# Patient Record
Sex: Male | Born: 1995 | Race: White | Hispanic: No | Marital: Single | Smoking: Current every day smoker
Health system: Southern US, Community
[De-identification: ages and names within clinical notes are randomized; demographics above are authoritative.]

---
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
## 2019-06-22 ENCOUNTER — Other Ambulatory Visit: Payer: Self-pay

## 2019-06-22 ENCOUNTER — Emergency Department (HOSPITAL_COMMUNITY)
Admission: EM | Admit: 2019-06-22 | Discharge: 2019-06-22 | Disposition: A | Discharge status: Home / Self Care | Payer: Self-pay | Attending: Emergency Medicine | Admitting: Emergency Medicine

## 2019-06-22 ENCOUNTER — Encounter (HOSPITAL_COMMUNITY): Payer: Self-pay | Admitting: Emergency Medicine

## 2019-06-22 DIAGNOSIS — F17210 Nicotine dependence, cigarettes, uncomplicated: Secondary | ICD-10-CM | POA: Insufficient documentation

## 2019-06-22 DIAGNOSIS — R590 Localized enlarged lymph nodes: Secondary | ICD-10-CM | POA: Insufficient documentation

## 2019-06-22 MED ORDER — KETOROLAC TROMETHAMINE 60 MG/2ML IM SOLN
60.0000 mg | Freq: Once | INTRAMUSCULAR | Status: AC
  Administered 2019-06-22: 03:00:00 60 mg via INTRAMUSCULAR
  Filled 2019-06-22: qty 2

## 2019-06-22 MED ORDER — MELOXICAM 7.5 MG PO TABS: 7.5000 mg | ORAL_TABLET | Freq: Every day | ORAL | 0 refills | Status: AC

## 2019-06-22 NOTE — ED Triage Notes (Signed)
Patient is complaining of right neck pain that started about 7 months ago. Patient states he comes in tonight because the pain usually go away but has not tonight.

## 2019-06-22 NOTE — ED Provider Notes (Signed)
Emergency Department Provider Note   I have reviewed the triage vital signs and the nursing notes.   HISTORY  Chief Complaint Neck Pain   HPI Jeffrey Meyer is a 23 y.o. male who presents the emergency department today with any acute exacerbation of his chronic neck pain.  Patient states that multiple times over the last 7 months he has had neck pain.  Usually last a couple minutes and goes away.  He states today is lasted longer.  There is no associated trauma or strain to his neck but he has pain with movement of his neck which seems to be worse at the junction of his SCM with the sternum.  He also has some anterior right-sided neck pain and swelling.  Hurts to swallow sometimes.  No difficulty breathing.  No fevers.  No cough.  No other associated symptoms.   No other associated or modifying symptoms.    History reviewed. No pertinent past medical history.  There are no active problems to display for this patient.   History reviewed. No pertinent surgical history.  Current Outpatient Rx  . Order #: [id number] Class: Print    Allergies Patient has no known allergies.  History reviewed. No pertinent family history.  Social History Social History   Tobacco Use  . Smoking status: Current Every Day Smoker  . Smokeless tobacco: Never Used  Substance Use Topics  . Alcohol use: Not on file  . Drug use: Not on file    Review of Systems  All other systems negative except as documented in the HPI. All pertinent positives and negatives as reviewed in the HPI. ____________________________________________   PHYSICAL EXAM:  VITAL SIGNS: ED Triage Vitals  Enc Vitals Group     BP 06/22/19 0228 135/72     Pulse Rate 06/22/19 0228 92     Resp 06/22/19 0228 16     Temp 06/22/19 0228 97.7 F (36.5 C)     Temp Source 06/22/19 0228 Oral     SpO2 06/22/19 0228 99 %     Weight 06/22/19 0228 135 lb (61.2 kg)     Height 06/22/19 0228 5\' 11"  (1.803 m)     Head  Circumference --      Peak Flow --      Pain Score 06/22/19 0233 8     Pain Loc --      Pain Edu? --      Excl. in [place]? --     Constitutional: Alert and oriented. Well appearing and in no acute distress. Eyes: Conjunctivae are normal. PERRL. EOMI. Head: Atraumatic. Nose: No congestion/rhinnorhea. Mouth/Throat: Mucous membranes are moist.  Oropharynx non-erythematous. Neck: No stridor.  No meningeal signs.  Shoddy lymphadenopathy right anterior cervical chain. Cardiovascular: Normal rate, regular rhythm. Good peripheral circulation. Grossly normal heart sounds.   Respiratory: Normal respiratory effort.  No retractions. Lungs CTAB. Gastrointestinal: Soft and nontender. No distention.  Musculoskeletal: pain at insertion of SCM at right side of sternum Neurologic:  Normal speech and language. No gross focal neurologic deficits are appreciated.  Skin:  Skin is warm, dry and intact. No rash noted.  ____________________________________________   INITIAL IMPRESSION / ASSESSMENT AND PLAN / ED COURSE  Suspect patient has lymphadenopathy is probably was been caused him symptoms for the last couple months.  Suggested follow-up with PCP if not improving in a few weeks or if it progressively worsens.  He also has what seems to be some type of inflammation at the insertion of the sternocleidomastoid with  his right sternal area.  Will give anti-inflammatories for that.  No indication for imaging.  No evidence of infection.     Pertinent labs & imaging results that were available during my care of the patient were reviewed by me and considered in my medical decision making (see chart for details).  A medical screening exam was performed and I feel the patient has had an appropriate workup for their chief complaint at this time and likelihood of emergent condition existing is low. They have been counseled on decision, discharge, follow up and which symptoms necessitate immediate return to the emergency  department. They or their family verbally stated understanding and agreement with plan and discharged in stable condition.   ____________________________________________  FINAL CLINICAL IMPRESSION(S) / ED DIAGNOSES  Final diagnoses:  Lymphadenopathy, cervical     MEDICATIONS GIVEN DURING THIS VISIT:  Medications  ketorolac (TORADOL) injection 60 mg (60 mg Intramuscular Given 06/22/19 0310)     NEW OUTPATIENT MEDICATIONS STARTED DURING THIS VISIT:  New Prescriptions   MELOXICAM (MOBIC) 7.5 MG TABLET    Take 1 tablet (7.5 mg total) by mouth daily.    Note:  This note was prepared with assistance of Dragon voice recognition software. Occasional wrong-word or sound-a-like substitutions may have occurred due to the inherent limitations of voice recognition software.   Edy Belt, Barbara Cower, MD 06/22/19 [phone]

## 2019-08-05 ENCOUNTER — Ambulatory Visit (HOSPITAL_COMMUNITY)
Admission: EM | Admit: 2019-08-05 | Discharge: 2019-08-05 | Disposition: A | Discharge status: Home / Self Care | Payer: Self-pay | Attending: Family Medicine | Admitting: Family Medicine

## 2019-08-05 ENCOUNTER — Ambulatory Visit (INDEPENDENT_AMBULATORY_CARE_PROVIDER_SITE_OTHER): Payer: Self-pay

## 2019-08-05 ENCOUNTER — Encounter (HOSPITAL_COMMUNITY): Payer: Self-pay

## 2019-08-05 ENCOUNTER — Other Ambulatory Visit: Payer: Self-pay

## 2019-08-05 DIAGNOSIS — S99922A Unspecified injury of left foot, initial encounter: Secondary | ICD-10-CM

## 2019-08-05 MED ORDER — IBUPROFEN 800 MG PO TABS: 800.0000 mg | ORAL_TABLET | Freq: Three times a day (TID) | ORAL | 0 refills | Status: AC

## 2019-08-05 NOTE — ED Provider Notes (Signed)
[facility]    CSN: [id number] Arrival date & time: 08/05/19  1511      History   Chief Complaint Chief Complaint  Patient presents with  . Toe Injury    HPI Jeffrey Meyer is a 23 y.o. male.   51 y old presented to the urgent care with a complaint of left big toe pain X 2 days after dropping a wooden pallet on it.  Patient has been using ice and kept her leg elevated. The pain is rated at 8 on scale of 1-10 and it is a throbbing pain. Activity make  worse and using ice relief the symptom a bit.  The history is provided by the patient. No language interpreter was used.    History reviewed. No pertinent past medical history.  There are no active problems to display for this patient.   History reviewed. No pertinent surgical history.     Home Medications    Prior to Admission medications   Medication Sig Start Date End Date Taking? Authorizing Provider  meloxicam (MOBIC) 7.5 MG tablet Take 1 tablet (7.5 mg total) by mouth daily. 06/22/19   Mesner, Corene Cornea, MD    Family History Family History  Family history unknown: Yes    Social History Social History   Tobacco Use  . Smoking status: Current Every Day Smoker  . Smokeless tobacco: Never Used  Substance Use Topics  . Alcohol use: Not on file  . Drug use: Not on file     Allergies   Patient has no known allergies.   Review of Systems Review of Systems  Constitutional: Negative for activity change, appetite change, chills, fatigue and fever.  Respiratory: Negative.   Cardiovascular: Negative.   Musculoskeletal: Positive for joint swelling.  ROS: All other are negatives  Physical Exam Triage Vital Signs ED Triage Vitals [08/05/19 1602]  Enc Vitals Group     BP 116/67     Pulse Rate 100     Resp 18     Temp 98.1 F (36.7 C)     Temp Source Oral     SpO2 96 %     Weight      Height      Head Circumference      Peak Flow      Pain Score      Pain Loc      Pain Edu?    Excl. in [place]?    No data found.  Updated Vital Signs BP 116/67 (BP Location: Right Arm)   Pulse 100   Temp 98.1 F (36.7 C) (Oral)   Resp 18   SpO2 96%   Visual Acuity Right Eye Distance:   Left Eye Distance:   Bilateral Distance:    Right Eye Near:   Left Eye Near:    Bilateral Near:     Physical Exam Vitals signs and nursing note reviewed.  Constitutional:      General: He is not in acute distress.    Appearance: Normal appearance. He is normal weight. He is not ill-appearing or toxic-appearing.  Cardiovascular:     Rate and Rhythm: Normal rate and regular rhythm.     Pulses: Normal pulses.     Heart sounds: Normal heart sounds. No murmur.  Pulmonary:     Effort: Pulmonary effort is normal. No respiratory distress.     Breath sounds: Normal breath sounds.  Chest:     Chest wall: No tenderness.  Musculoskeletal: Normal range of motion.  Left ankle: Normal. He exhibits normal range of motion and no swelling.     Comments: Left great toe is tender with limited ROM  Neurological:     Mental Status: He is alert.   08/05/19     UC Treatments / Results  Labs (all labs ordered are listed, but only abnormal results are displayed) Labs Reviewed - No data to display  EKG   Radiology Dg Toe Great Left  Result Date: 08/05/2019 CLINICAL DATA:  Toe pain. EXAM: LEFT GREAT TOE COMPARISON:  None. FINDINGS: There is no evidence of fracture or dislocation. There is no evidence of arthropathy or other focal bone abnormality. Soft tissues are unremarkable. IMPRESSION: Negative. Electronically Signed   By: Katherine Mantle M.D.   On: 08/05/2019 16:19    Procedures Procedures (including critical care time)  Medications Ordered in UC Medications - No data to display  Initial Impression / Assessment and Plan / UC Course  I have reviewed the triage vital signs and the nursing notes.  Pertinent labs & imaging results that were available during my care of the patient  were reviewed by me and considered in my medical decision making (see chart for details).   Patient was discharge in stable condition and was advised to return to the urgent care if symptom get worse Final Clinical Impressions(s) / UC Diagnoses   Final diagnoses:  Toe injury, left, initial encounter     Discharge Instructions     Patient was advised to take Ibuprofen as needed for pain Follow RICE instruction     ED Prescriptions    None     PDMP not reviewed this encounter.   Durward Parcel, FNP 08/05/19 1645

## 2019-08-05 NOTE — Discharge Instructions (Addendum)
Patient was advised to take Ibuprofen as needed for pain Follow RICE instruction that is attached to the discharge instruction Return to the urgent care if symptom get worse

## 2019-08-05 NOTE — ED Triage Notes (Signed)
Pt presents with left big toe injury after dropping a wooden pallet on it Saturday night.

## 2020-07-26 IMAGING — DX DG TOE GREAT 2+V*L*
3 series · 3 of 3 positions shown · non-contrast
Comparison: None.

CLINICAL DATA: Toe pain.

EXAM:
LEFT GREAT TOE

[toe ap]
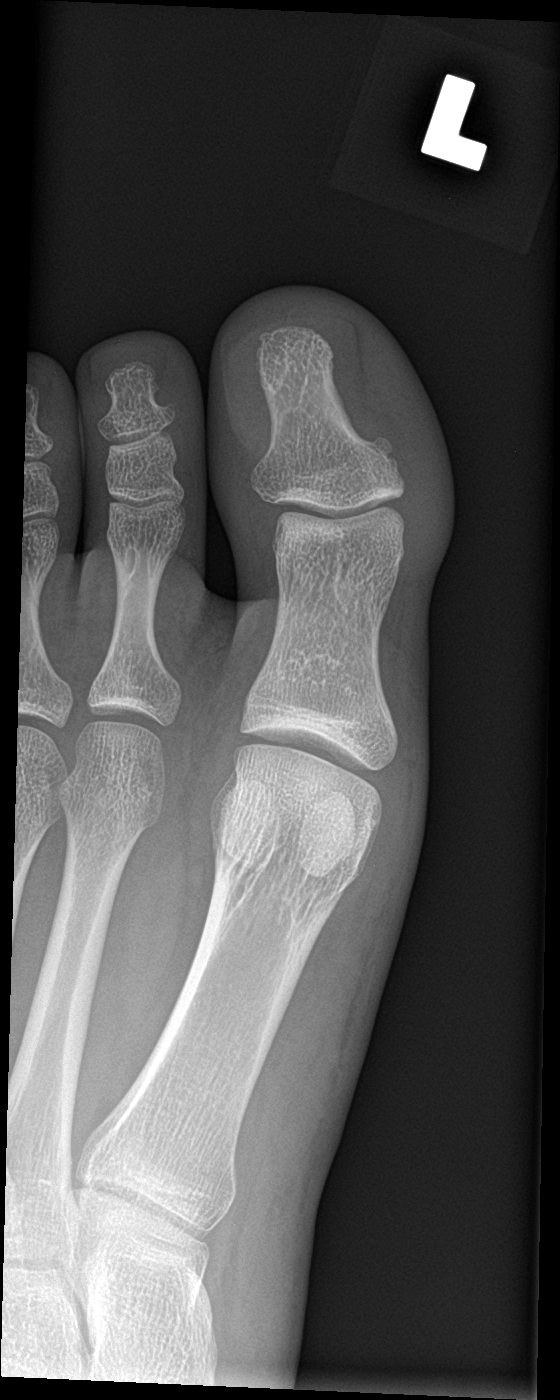

[toe obl]
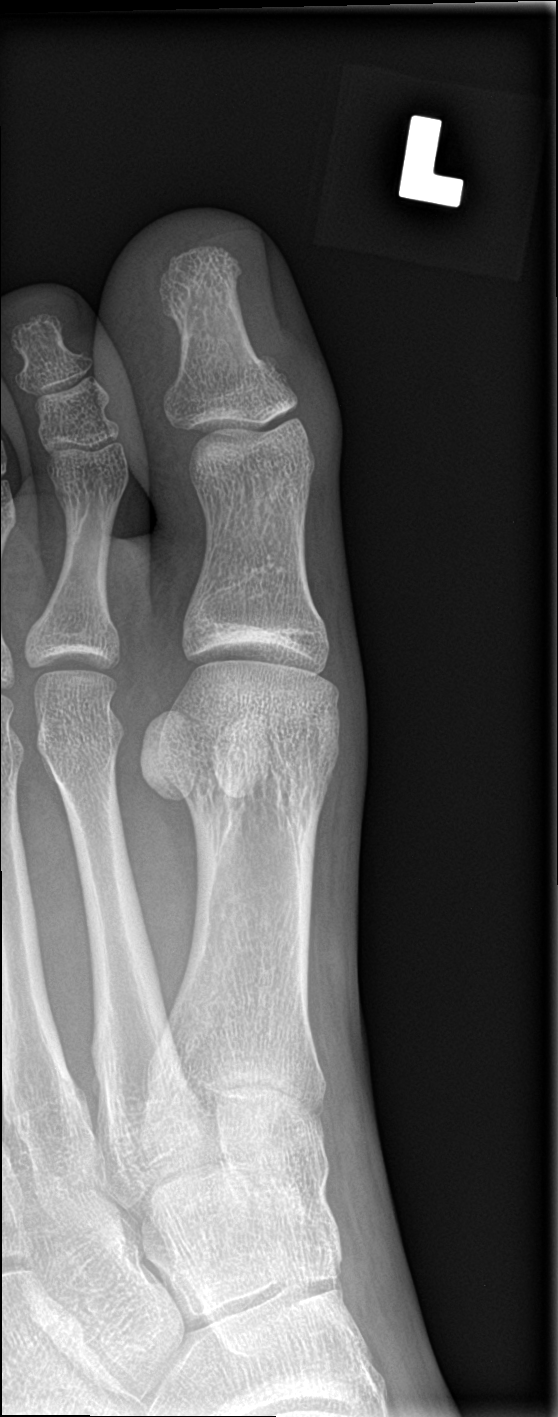

[toe lat]
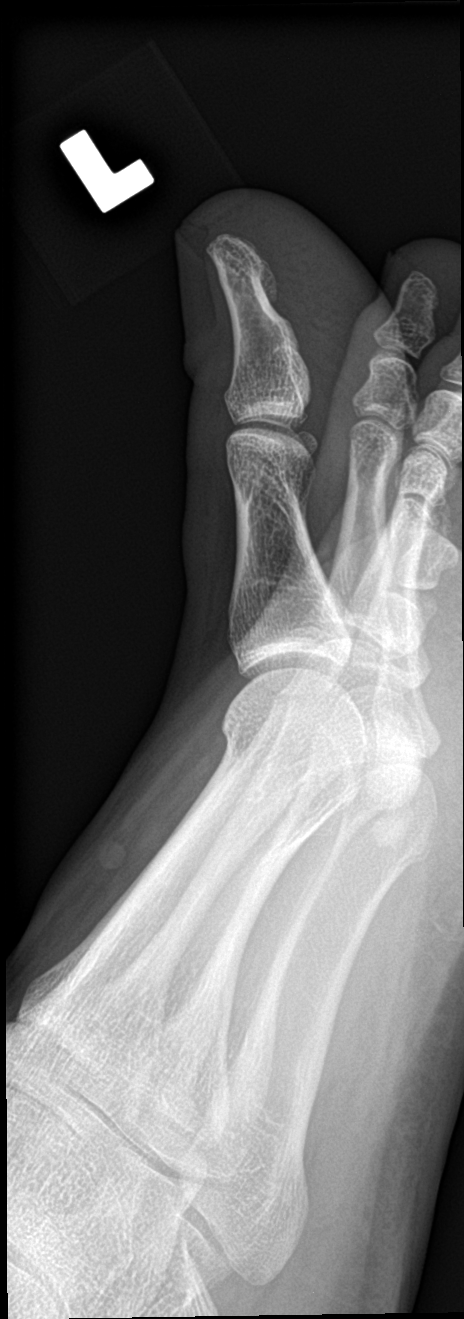

[3 of 3 positions shown; findings below may reference images not displayed]

FINDINGS: There is no evidence of fracture or dislocation. There is no
evidence of arthropathy or other focal bone abnormality. Soft
tissues are unremarkable.
IMPRESSION: Negative.

## 2022-03-05 ENCOUNTER — Ambulatory Visit (HOSPITAL_COMMUNITY)
Admission: RE | Admit: 2022-03-05 | Discharge: 2022-03-05 | Disposition: A | Discharge status: Home / Self Care | Payer: Self-pay | Source: Ambulatory Visit | Attending: Student | Admitting: Student

## 2022-03-05 ENCOUNTER — Encounter (HOSPITAL_COMMUNITY): Payer: Self-pay

## 2022-03-05 VITALS — BP 119/68 | HR 86 | Temp 98.9°F | Resp 18 | Ht 70.0 in | Wt 135.0 lb

## 2022-03-05 DIAGNOSIS — J039 Acute tonsillitis, unspecified: Secondary | ICD-10-CM | POA: Insufficient documentation

## 2022-03-05 DIAGNOSIS — Z112 Encounter for screening for other bacterial diseases: Secondary | ICD-10-CM | POA: Insufficient documentation

## 2022-03-05 LAB — POCT RAPID STREP A, ED / UC: Streptococcus, Group A Screen (Direct): NEGATIVE

## 2022-03-05 LAB — POCT INFECTIOUS MONO SCREEN, ED / UC: Mono Screen: NEGATIVE

## 2022-03-05 MED ORDER — AMOXICILLIN 500 MG PO CAPS: 500.0000 mg | ORAL_CAPSULE | Freq: Two times a day (BID) | ORAL | 0 refills | Status: AC

## 2022-03-05 NOTE — Discharge Instructions (Signed)
-  Your mono and strep tests were negative. I think you may have strep throat, but we took the test too early for it to turn positive. We'll go ahead and treat for strep.  -Start the antibiotic-Amoxicillin, 1 pill every 12 hours for 10 days.  You can take this with food like with breakfast and dinner. -You can continue tylenol/ibuprofen for discomfort, and make sure to drink plenty of fluids -You'll still be contagious for 24 hours after starting the antibiotic. This means you can go back to work in 1 day.  -Make sure to throw out your toothbrush after 24 hours so you don't give the strep back to yourself.  -Seek additional medical attention if symptoms are getting worse instead of better- trouble swallowing, shortness of breath, voice changes, etc.

## 2022-03-05 NOTE — ED Provider Notes (Signed)
[facility]    CSN: [id number] Arrival date & time: 03/05/22  1228      History   Chief Complaint Chief Complaint  Patient presents with   Sore Throat    my throat started feeling really sore on friday June 30th. it's a bit painful to swallow. - Entered by patient   Appointment    HPI Jeffrey Meyer is a 26 y.o. male presenting with sore throat x1 day. History noncontributory, has not had mono to his knowledge. Occ nonproductive cough. Denies n/v/d/c. Denies SOB, CP, fevers.    HPI  History reviewed. No pertinent past medical history.  There are no problems to display for this patient.   History reviewed. No pertinent surgical history.     Home Medications    Prior to Admission medications   Medication Sig Start Date End Date Taking? Authorizing Provider  amoxicillin (AMOXIL) 500 MG capsule Take 1 capsule (500 mg total) by mouth in the morning and at bedtime for 10 days. 03/05/22 03/15/22 Yes Rhys Martini, PA-C  ibuprofen (ADVIL) 800 MG tablet Take 1 tablet (800 mg total) by mouth 3 (three) times daily. 08/05/19   Avegno, Zachery Dakins, FNP  meloxicam (MOBIC) 7.5 MG tablet Take 1 tablet (7.5 mg total) by mouth daily. 06/22/19   Mesner, Barbara Cower, MD    Family History Family History  Problem Relation Age of Onset   Healthy Mother    Healthy Father     Social History Social History   Tobacco Use   Smoking status: Every Day   Smokeless tobacco: Never  Vaping Use   Vaping Use: Never used  Substance Use Topics   Alcohol use: Never   Drug use: Never     Allergies   Patient has no known allergies.   Review of Systems Review of Systems  Constitutional:  Negative for appetite change, chills and fever.  HENT:  Positive for sore throat. Negative for congestion, ear pain, rhinorrhea, sinus pressure and sinus pain.   Eyes:  Negative for redness and visual disturbance.  Respiratory:  Negative for cough, chest tightness, shortness of breath and  wheezing.   Cardiovascular:  Negative for chest pain and palpitations.  Gastrointestinal:  Negative for abdominal pain, constipation, diarrhea, nausea and vomiting.  Genitourinary:  Negative for dysuria, frequency and urgency.  Musculoskeletal:  Negative for myalgias.  Neurological:  Negative for dizziness, weakness and headaches.  Psychiatric/Behavioral:  Negative for confusion.   All other systems reviewed and are negative.    Physical Exam Triage Vital Signs ED Triage Vitals  Enc Vitals Group     BP 03/05/22 1322 119/68     Pulse Rate 03/05/22 1322 86     Resp 03/05/22 1322 18     Temp 03/05/22 1322 98.9 F (37.2 C)     Temp Source 03/05/22 1322 Oral     SpO2 03/05/22 1322 98 %     Weight 03/05/22 1323 135 lb (61.2 kg)     Height 03/05/22 1323 5\' 10"  (1.778 m)     Head Circumference --      Peak Flow --      Pain Score 03/05/22 1323 5     Pain Loc --      Pain Edu? --      Excl. in [place]? --    No data found.  Updated Vital Signs BP 119/68 (BP Location: Left Arm)   Pulse 86   Temp 98.9 F (37.2 C) (Oral)   Resp 18  Ht 5\' 10"  (1.778 m)   Wt 135 lb (61.2 kg)   SpO2 98%   BMI 19.37 kg/m   Visual Acuity Right Eye Distance:   Left Eye Distance:   Bilateral Distance:    Right Eye Near:   Left Eye Near:    Bilateral Near:     Physical Exam Vitals reviewed.  Constitutional:      Appearance: Normal appearance. He is not ill-appearing.  HENT:     Head: Normocephalic and atraumatic.     Right Ear: Hearing, tympanic membrane, ear canal and external ear normal. No swelling or tenderness. No middle ear effusion. There is no impacted cerumen. No mastoid tenderness. Tympanic membrane is not injected, scarred, perforated, erythematous, retracted or bulging.     Left Ear: Hearing, tympanic membrane, ear canal and external ear normal. No swelling or tenderness.  No middle ear effusion. There is no impacted cerumen. No mastoid tenderness. Tympanic membrane is not injected,  scarred, perforated, erythematous, retracted or bulging.     Mouth/Throat:     Pharynx: Oropharynx is clear. Posterior oropharyngeal erythema present. No oropharyngeal exudate.     Tonsils: No tonsillar exudate. 2+ on the right. 2+ on the left.     Comments: Tonsils 2+ bilaterally with erythema but no exudate. On exam, uvula is midline, he is tolerating her secretions without difficulty, there is no trismus, no drooling, he has normal phonation  Cardiovascular:     Rate and Rhythm: Normal rate and regular rhythm.     Heart sounds: Normal heart sounds.  Pulmonary:     Effort: Pulmonary effort is normal.     Breath sounds: Normal breath sounds.  Lymphadenopathy:     Cervical: No cervical adenopathy.  Neurological:     General: No focal deficit present.     Mental Status: He is alert and oriented to person, place, and time.  Psychiatric:        Mood and Affect: Mood normal.        Behavior: Behavior normal.        Thought Content: Thought content normal.        Judgment: Judgment normal.      UC Treatments / Results  Labs (all labs ordered are listed, but only abnormal results are displayed) Labs Reviewed  CULTURE, GROUP A STREP [facility])  POCT RAPID STREP A, ED / UC  POCT INFECTIOUS MONO SCREEN, ED / UC    EKG   Radiology No results found.  Procedures Procedures (including critical care time)  Medications Ordered in UC Medications - No data to display  Initial Impression / Assessment and Plan / UC Course  I have reviewed the triage vital signs and the nursing notes.  Pertinent labs & imaging results that were available during my care of the patient were reviewed by me and considered in my medical decision making (see chart for details).     This patient is a very pleasant 26 y.o. year old male presenting with tonsillitis. Afebrile, nontachy.  Today on exam there is no asymmetry, low suspicion for deep space infection.  No evidence of bacteremia, sepsis.  Rapid strep  negative, culture sent Monospot negative.  Clinically appears to have strep. Will manage with amoxicillin as below, he is in agreement.   ED return precautions discussed. Patient verbalizes understanding and agreement.     Final Clinical Impressions(s) / UC Diagnoses   Final diagnoses:  Screening for streptococcal infection  Acute tonsillitis, unspecified etiology     Discharge Instructions      -  Your mono and strep tests were negative. I think you may have strep throat, but we took the test too early for it to turn positive. We'll go ahead and treat for strep.  -Start the antibiotic-Amoxicillin, 1 pill every 12 hours for 10 days.  You can take this with food like with breakfast and dinner. -You can continue tylenol/ibuprofen for discomfort, and make sure to drink plenty of fluids -You'll still be contagious for 24 hours after starting the antibiotic. This means you can go back to work in 1 day.  -Make sure to throw out your toothbrush after 24 hours so you don't give the strep back to yourself.  -Seek additional medical attention if symptoms are getting worse instead of better- trouble swallowing, shortness of breath, voice changes, etc.     ED Prescriptions     Medication Sig Dispense Auth. Provider   amoxicillin (AMOXIL) 500 MG capsule Take 1 capsule (500 mg total) by mouth in the morning and at bedtime for 10 days. 20 capsule Rhys Martini, PA-C      PDMP not reviewed this encounter.   Rhys Martini, PA-C 03/05/22 1517

## 2022-03-05 NOTE — ED Triage Notes (Signed)
Patient c/o sore throat x 1 day, some cough, afebrile.  Patient denies any OTC meds.

## 2022-03-07 LAB — CULTURE, GROUP A STREP (THRC)

## 2022-03-08 LAB — CULTURE, GROUP A STREP (THRC)
